# Patient Record
Sex: Female | Born: 1935 | Race: White | Hispanic: No | Smoking: Never smoker
Health system: Southern US, Community
[De-identification: ages and names within clinical notes are randomized; demographics above are authoritative.]

## PROBLEM LIST (undated history)

## (undated) DIAGNOSIS — I10 Essential (primary) hypertension: Secondary | ICD-10-CM

## (undated) DIAGNOSIS — G20 Parkinson's disease: Secondary | ICD-10-CM

## (undated) DIAGNOSIS — F0390 Unspecified dementia without behavioral disturbance: Secondary | ICD-10-CM

## (undated) DIAGNOSIS — M1990 Unspecified osteoarthritis, unspecified site: Secondary | ICD-10-CM

---

## 1997-11-16 ENCOUNTER — Other Ambulatory Visit: Admission: RE | Admit: 1997-11-16 | Discharge: 1997-11-16 | Payer: Self-pay | Admitting: Internal Medicine

## 1997-12-26 ENCOUNTER — Ambulatory Visit (HOSPITAL_COMMUNITY): Admission: RE | Admit: 1997-12-26 | Discharge: 1997-12-26 | Payer: Self-pay | Admitting: Gastroenterology

## 1998-11-18 ENCOUNTER — Encounter: Payer: Self-pay | Admitting: Internal Medicine

## 1998-11-18 ENCOUNTER — Encounter: Admission: RE | Admit: 1998-11-18 | Discharge: 1998-11-18 | Payer: Self-pay | Admitting: Internal Medicine

## 1999-11-27 ENCOUNTER — Encounter: Payer: Self-pay | Admitting: Internal Medicine

## 1999-11-27 ENCOUNTER — Other Ambulatory Visit: Admission: RE | Admit: 1999-11-27 | Discharge: 1999-11-27 | Payer: Self-pay | Admitting: Internal Medicine

## 1999-11-27 ENCOUNTER — Encounter: Admission: RE | Admit: 1999-11-27 | Discharge: 1999-11-27 | Payer: Self-pay | Admitting: Internal Medicine

## 2000-07-01 ENCOUNTER — Encounter: Payer: Self-pay | Admitting: Orthopedic Surgery

## 2000-07-01 ENCOUNTER — Inpatient Hospital Stay (HOSPITAL_COMMUNITY): Admission: AD | Admit: 2000-07-01 | Discharge: 2000-07-05 | Payer: Self-pay | Admitting: Orthopedic Surgery

## 2000-07-01 ENCOUNTER — Encounter: Payer: Self-pay | Admitting: Emergency Medicine

## 2000-07-05 ENCOUNTER — Inpatient Hospital Stay (HOSPITAL_COMMUNITY)
Admission: RE | Admit: 2000-07-05 | Discharge: 2000-07-13 | Payer: Self-pay | Admitting: Physical Medicine & Rehabilitation

## 2000-11-30 ENCOUNTER — Encounter: Payer: Self-pay | Admitting: Internal Medicine

## 2000-11-30 ENCOUNTER — Encounter: Admission: RE | Admit: 2000-11-30 | Discharge: 2000-11-30 | Payer: Self-pay | Admitting: Internal Medicine

## 2001-06-13 ENCOUNTER — Encounter: Admission: RE | Admit: 2001-06-13 | Discharge: 2001-06-13 | Payer: Self-pay | Admitting: Internal Medicine

## 2001-06-13 ENCOUNTER — Encounter: Payer: Self-pay | Admitting: Internal Medicine

## 2001-08-09 ENCOUNTER — Encounter (HOSPITAL_COMMUNITY): Admission: RE | Admit: 2001-08-09 | Discharge: 2001-09-08 | Payer: Self-pay | Admitting: Internal Medicine

## 2001-12-14 ENCOUNTER — Encounter: Payer: Self-pay | Admitting: Internal Medicine

## 2001-12-14 ENCOUNTER — Encounter: Admission: RE | Admit: 2001-12-14 | Discharge: 2001-12-14 | Payer: Self-pay | Admitting: Internal Medicine

## 2002-12-26 ENCOUNTER — Encounter: Admission: RE | Admit: 2002-12-26 | Discharge: 2002-12-26 | Payer: Self-pay | Admitting: Internal Medicine

## 2003-02-06 ENCOUNTER — Inpatient Hospital Stay (HOSPITAL_COMMUNITY): Admission: EM | Admit: 2003-02-06 | Discharge: 2003-02-08 | Payer: Self-pay | Admitting: Emergency Medicine

## 2003-10-02 ENCOUNTER — Ambulatory Visit (HOSPITAL_COMMUNITY): Admission: RE | Admit: 2003-10-02 | Discharge: 2003-10-02 | Payer: Self-pay | Admitting: Neurology

## 2004-01-30 ENCOUNTER — Encounter: Admission: RE | Admit: 2004-01-30 | Discharge: 2004-01-30 | Payer: Self-pay | Admitting: Internal Medicine

## 2005-02-02 ENCOUNTER — Encounter: Admission: RE | Admit: 2005-02-02 | Discharge: 2005-02-02 | Payer: Self-pay | Admitting: Internal Medicine

## 2006-02-05 ENCOUNTER — Encounter: Admission: RE | Admit: 2006-02-05 | Discharge: 2006-02-05 | Payer: Self-pay | Admitting: Internal Medicine

## 2007-03-15 ENCOUNTER — Encounter: Admission: RE | Admit: 2007-03-15 | Discharge: 2007-03-15 | Payer: Self-pay | Admitting: Internal Medicine

## 2008-03-15 ENCOUNTER — Encounter: Admission: RE | Admit: 2008-03-15 | Discharge: 2008-03-15 | Payer: Self-pay | Admitting: Internal Medicine

## 2008-12-13 ENCOUNTER — Ambulatory Visit (HOSPITAL_COMMUNITY): Admission: RE | Admit: 2008-12-13 | Discharge: 2008-12-13 | Payer: Self-pay | Admitting: Internal Medicine

## 2009-03-22 ENCOUNTER — Encounter: Payer: Self-pay | Admitting: Orthopedic Surgery

## 2009-04-10 ENCOUNTER — Ambulatory Visit (HOSPITAL_COMMUNITY): Admission: RE | Admit: 2009-04-10 | Discharge: 2009-04-10 | Payer: Self-pay | Admitting: Internal Medicine

## 2009-04-11 ENCOUNTER — Ambulatory Visit (HOSPITAL_COMMUNITY): Admission: RE | Admit: 2009-04-11 | Discharge: 2009-04-11 | Payer: Self-pay | Admitting: Internal Medicine

## 2009-04-11 ENCOUNTER — Encounter: Payer: Self-pay | Admitting: Orthopedic Surgery

## 2009-04-15 ENCOUNTER — Encounter: Admission: RE | Admit: 2009-04-15 | Discharge: 2009-04-15 | Payer: Self-pay | Admitting: Internal Medicine

## 2009-04-17 ENCOUNTER — Ambulatory Visit: Payer: Self-pay | Admitting: Orthopedic Surgery

## 2009-04-17 DIAGNOSIS — S22009A Unspecified fracture of unspecified thoracic vertebra, initial encounter for closed fracture: Secondary | ICD-10-CM | POA: Insufficient documentation

## 2009-04-17 DIAGNOSIS — M545 Low back pain: Secondary | ICD-10-CM

## 2009-05-13 ENCOUNTER — Telehealth: Payer: Self-pay | Admitting: Orthopedic Surgery

## 2010-02-02 ENCOUNTER — Encounter: Payer: Self-pay | Admitting: Neurology

## 2010-02-11 NOTE — Assessment & Plan Note (Signed)
Summary: CONSULT/TREAT T7 FX/XR+MRI [facility]/REF R.FAGAN/MEDICARE,AARP/CAF   Vital Signs:  Patient profile:   77 year old female Height:      62 inches Weight:      112 pounds Pulse rate:   68 / minute Resp:     16 per minute  Vitals Entered By: Fuller Canada MD (April 17, 2009 9:16 AM)  Visit Type:  Initial Consult Referring Provider:  Dr. Ouida Sills Primary Provider:  Dr. Ouida Sills  CC:  T spine fracture.  History of Present Illness: This is an 77 year old female who has had multiple falls because of Parkinson's disease and now presents after a recent fall complaining of back pain.  X-rays were obtained of the thoracic spine and this included MRI of the thoracic spine which showed a T6-T7 bulging disc with facet arthritis as well as a 70% loss of height compression fracture with edema.  Her the patient was placed in a Cash brace but could not wear it because of her size.  She had such severe kyphosis that the brace R. Winn intervertebral when she sat down.  She complains of right-sided lower back pain and gluteal pain with no radiculopathy, no numbness or tingling in hands or feet or legs.  Her pain is 06/1010 throbbing and on relieved by Tylenol and Advil.  [facility] xrays and MRI 04/11/09.  DOI 2 weeks ago, fell.  Meds: Actonel, Carbidopa/levodopa, Chlordiazep/Amitrip, Digoxin, HCTZ, Lorazepam, Sular, Enablez.     Allergies (verified): No Known Drug Allergies  Past History:  Past Medical History: Parkinsons Osteoporosis htn anxiety  Past Surgical History: hip  Family History: FH of Cancer:   Social History: Patient is widowed.  retired no smoking no alcohol no caffeine  Review of Systems Constitutional:  Denies weight loss, weight gain, fever, chills, and fatigue. Cardiovascular:  Denies chest pain, palpitations, fainting, and murmurs. Respiratory:  Denies short of breath, wheezing, couch, tightness, pain on inspiration, and snoring . Gastrointestinal:  Complains of  heartburn; denies nausea, vomiting, diarrhea, constipation, and blood in your stools. Genitourinary:  Denies frequency, urgency, difficulty urinating, painful urination, flank pain, and bleeding in urine. Neurologic:  Complains of unsteady gait; denies numbness, tingling, dizziness, tremors, and seizure. Musculoskeletal:  Denies joint pain, swelling, instability, stiffness, redness, heat, and muscle pain. Endocrine:  Denies excessive thirst, exessive urination, and heat or cold intolerance. Psychiatric:  Complains of depression; denies nervousness, anxiety, and hallucinations. Skin:  Complains of redness; denies changes in the skin, poor healing, rash, and itching. HEENT:  Denies blurred or double vision, eye pain, redness, and watering. Immunology:  Denies seasonal allergies, sinus problems, and allergic to bee stings. Hemoatologic:  Denies easy bleeding and brusing.  Physical Exam  Additional Exam:  GEN: well developed, well nourished, normal grooming and hygiene, no deformity and normal body habitus.  vitals are weight 112 pounds height 5 feet 2 CDV: pulses are normal, no edema, no erythema. no tenderness  Lymph: normal lymph nodes   Skin: no rashes, skin lesions or open sores   NEURO: normal coordination, reflexes, sensation.   Psyche: awake, alert and oriented. Mood normal   Gait:  ambulates with severe kyphosis of the thoracic spine, uses a walker  Inspection, thoracic- lumbar spine skin is normal.  Cyst.  Tenderness in the RIGHT gluteal region nontender over the entire lumbar spine.  Upper extremities and lower extremities have normal motor exam.      Impression & Recommendations:  Problem # 1:  VERTEBRAL FRACTURE, THORACIC SPINE (ICD-805.2) Assessment New  assessment:  I did not find tenderness in the RIGHT the lumbar spine specifically at the fracture site.  She was tender in the RIGHT lower back and gluteal region.  Recommend Lidoderm patch, we discussed  vertebroplasty but I do not feel it would be of benefit since she's not having tenderness at this area.  We also discussed the thoracic- lumbar bracing without pain or tenderness at the fracture site see no reason to go to a custom bracing  Orders: Misc. Referral (Misc. Ref) Consultation Level III (16109)  Problem # 2:  LOW BACK PAIN (ICD-724.2) Assessment: New  Orders: Consultation Level III (60454)  Patient Instructions: 1)  Please schedule a follow-up appointment as needed.

## 2010-02-11 NOTE — Letter (Signed)
Summary: History form  History form   Imported By: Jacklynn Ganong 04/18/2009 15:53:12  _____________________________________________________________________  External Attachment:    Type:   Image     Comment:   External Document

## 2010-02-11 NOTE — Letter (Signed)
Summary: Consultant Letter  All     ,     Phone:   Fax:     04/17/2009  Dear Dr.Roy :  I am writing to report my evaluation of [name], a 77 Years Old Female on whom I consulted 04/17/2009 at the office. When I saw the patient the Chief Complaint was "T spine fracture".    At that time the history of the present illness was as follows: This is an 77 year old female who has had multiple falls because of Parkinson's disease and now presents after a recent fall complaining of back pain.  X-rays were obtained of the thoracic spine and this included MRI of the thoracic spine which showed a T6-T7 bulging disc with facet arthritis as well as a 70% loss of height compression fracture with edema. Tandhe patient was placed in a Cash brace but could not wear it because of her size.  She had such severe kyphosis that the brace he is impinged on her throat and she sat down.  She complains of right-sided lower back pain and gluteal pain with no radiculopathy, no numbness or tingling in hands or feet or legs.  Her pain is 6/10; throbbing and not relieved by Tylenol and Advil.  [facility] xrays and MRI 04/11/09.  DOI 2 weeks ago, fell.  Meds: Actonel, Carbidopa/levodopa, Chlordiazep/Amitrip, Digoxin, HCTZ, Lorazepam, Sular, Enablez.   [name] Past Medical History at the time of my evaluation was notable for: VERTEBRAL FRACTURE, THORACIC SPINE (ICD-805.2).  .    Other history is as follows: Parkinsons Osteoporosis htn anxiety .  The family history is notable for: FH of Cancer:  .  The social history is notable for: Patient is widowed.  retired no smoking no alcohol no caffeine.    I have attached a copy of my clinic note to the end of this letter which details the review of systems and physical examination for your review. My assessment at that time was as follows: back pain and and thoracic T7 compression fracture.  The plan at that time was as follows:Lidoderm patches no bracing .  Anaisha's  active problems now include:  LOW BACK PAIN (ICD-724.2) VERTEBRAL FRACTURE, THORACIC SPINE (ICD-805.2)    I will send copies of the results of the above investigations as they become available to your office if you wish. The most recent office note and chart summary are attached. Thank you for the opportunity to participate in the care of [name].  Yours truly,   Fuller Canada MD

## 2010-02-11 NOTE — Medication Information (Signed)
Summary: RX Folder  RX Folder   Imported By: Cammie Sickle 04/18/2009 11:48:00  _____________________________________________________________________  External Attachment:    Type:   Image     Comment:   External Document

## 2010-02-11 NOTE — Progress Notes (Signed)
Summary: call from son question,possible side effect pain patch  Phone Note Call from Patient   Caller: Son Summary of Call: [name], patient's son and POA, called to relay that the pain patches seem to cause mother to be confused, "even hallucinating."  States he did remove patch and has not had on, since Friday 05/10/09.  He has also contacted primary care physician.   Please advise. His phone # [phone] (same as patient's)  Their pharmacy is [facility] in [place]. Initial call taken by: Cammie Sickle,  May 13, 2009 3:20 PM  Follow-up for Phone Call        whats the medciation? and dosage ?  stop the medication  Follow-up by: Fuller Canada MD,  May 13, 2009 4:34 PM  Additional Follow-up for Phone Call Additional follow up Details #1::        Lidoderm patch Additional Follow-up by: Cammie Sickle,  May 13, 2009 5:09 PM
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
## 2010-05-30 NOTE — Discharge Summary (Signed)
[place]. [facility]  Patient:    [name], [name]                         MRN: [id number] Adm. Date:  [id number] Disc. Date: [id number] Attending:  Herold Harms Dictator:   Arnoldo Morale, P.A.-C.                           Discharge Summary  ADMISSION DIAGNOSES: 1. Right displaced femoral neck fracture. 2. Hypertension. 3. Gastroesophageal reflux disease. 4. Depression. 5. Palpitations.  DISCHARGE DIAGNOSES: 1. Right displaced femoral neck fracture. 2. Hypertension. 3. Gastroesophageal reflux disease. 4. Depression. 5. Palpitations. 6. Temporary postoperative confusion. 7. Post hemorrhagic anemia.  SURGICAL PROCEDURE:  On July 01, 2000 Mrs. Parish underwent a right hip hemiarthroplasty by Dr. Jonny Ruiz L. Rendall assisted by Arnoldo Morale, P.A.-C.  COMPLICATIONS:  None.  CONSULTS: 1. Pharmacy consult for Coumadin therapy on July 01, 2000. 2. PT and rehab medicine consult on July 02, 2000.  HISTORY OF PRESENT ILLNESS:  This 77 year old white female was at home dusting a desk when she got her foot caught underneath the desk and fell landing on her right hip.  She was able to stand but complained of a moderate amount of pain in that right hip.  She was subsequently brought to [facility] Emergency Department where she was found to have a right displaced femoral neck fracture.  She was admitted for surgical fixation of this fracture.  HOSPITAL COURSE:  Mrs. Crockett was taken from the emergency room to the operating room and she underwent surgery that evening.  She tolerated that well and without immediate postoperative complications.  She was subsequently transferred to 5000.  On postoperative day one, she was afebrile.  Vital signs were stable.  Hemoglobin was 10, hematocrit 28.6.  Right hip dressing was intact and leg was neurovascularly intact.  She was started on PT per protocol and monitored.  On postoperative day two, temperature maximum was 102.2.   Vitals were stable. She did have some difficulty with following commands and seemed slightly confused.  This did seem to resolve over the weekend.  Dressing was changed and leg was neurovascularly intact.  She continued to make good progress over the next several days and was believed to be ready to go to rehab on July 05, 2000.  Her temperature maximum at that time was 101.1, pulse 86, respirations 20, and blood pressure 110/65. Right hip incision well approximated with staples without drainage.  PT was 19.6 with an INR of 2.1.  She was ready for transfer to rehab that day and was transferred.  DISCHARGE INSTRUCTIONS:  She is to continue her current hospitalization medications and diet with adjustments to be made per rehab.  CURRENT MEDICATIONS AT THE TIME OF TRANSFER:  1. Colace 100 mg p.o. b.i.d.  2. Senokot one tablet p.o. b.i.d.  3. Vitamin E 400 iu p.o. q.d.  4. Multivitamin one tablet p.o. q.d.  5. Calcium carbonate 500 mg p.o. q.d.  6. Protonix 40 mg p.o. q.d.  7. Ativan 1 mg p.o. q.h.s.  8. Sular 20 mg p.o. q.d.  9. Lanoxin 0.125 mg p.o. q.d. 10. Elavil 12.5 mg p.o. q.d. 11. Librium 5 mg p.o. q.d. 12. Coumadin 5 mg p.o. q.d. 13. EOC LOC p.r.n. 14. Reglan 10 mg p.o. q.8h. p.r.n. 15. Percocet 1 to 2 tablets p.o. q.4h. p.r.n. pain. 16. Phenergan 12.5 to 25 mg  p.o. q.6h. p.r.n. 17. Skelaxin 1 to 2 tablets p.o. q.6-8h. p.r.n. pain. 18. Benadryl 12.5 to 25 mg IV q.6h. p.r.n. itching. 19. Reglan 10 mg IV q.6h. p.r.n. nausea.  ACTIVITY:  She is to continue weight bearing as tolerated on the right leg with the use of a walker. She is to continue PT and OT per rehab protocol.  Staples can be removed from her right hip incision on postoperative day 14 with Steri-Strips with Benzoin applied.  She needs to follow up with Dr. Priscille Kluver in our office in approximately two weeks after discharge or at postoperative day 14 if staples are not removed in rehab.  Please notify Dr. Priscille Kluver  if temperature greater than 101.5, chills, pain unrelieved by pain medications or foul smelling drainage from the wound.  LABORATORY DATA:  X-ray taken at [facility] on July 01, 2000 showed a right femoral neck fracture that was impacted and slightly displaced.  Chest x-ray at that time showed mild cardiac prominence, calcified granuloma left upper lobe and no acute findings.  X-ray taken postoperatively on July 01, 2000 showed right hip arthroplasty in satisfactory position with the pelvis intact.  On July 02, 2000 hemoglobin 10, hematocrit 28.6.  On July 03, 2000 hemoglobin 9.7, hematocrit 27.8.  On July 04, 2000 hemoglobin 9.1, hematocrit 26.2.  On July 02, 2000 PT 15, INR 1.3.  On July 04, 2000 PT 18.7, INR 1.9.  On July 01, 2000 glucose was 116.  On July 02, 2000 it was 123 and on July 03, 2000 it was 131.  All other laboratory studies were within normal limits. DD:  07/27/00 TD:  07/27/00 Job: [id number]

## 2010-05-30 NOTE — Discharge Summary (Signed)
[place]. [facility]  Patient:    [name], [name]                         MRN: [id number] Adm. Date:  [id number] Disc. Date: [id number] Attending:  Herold Harms Dictator:   Junie Bame, P.A. CC:         Rande Brunt. Thomasena Edis, M.D.  John L. Dorothyann Gibbs, M.D.  Theressa Millard, M.D.   Discharge Summary  DISCHARGE DIAGNOSES: 1. Status post right total hip arthroplasty. 2. Hypertension. 3. Anemia. 4. Urinary tract infection. 5. Anxiety. 6. Gastroesophageal reflux disease.  HISTORY OF PRESENT ILLNESS:  [name] is a 77 year old white female admitted on July 01, 2000 after a fall sustaining a right displaced femoral neck fracture.  The patient underwent a right total hip replacement on July 01, 2000 by Dr. Jonny Ruiz L. Rendall III.  The patient was placed on Coumadin for DVT prophylaxis.  Postoperative complications were anemia secondary to blood loss.  PT report at that time indicated the patient was ambulating moderate assist, +1 for safety, nine feet with standard walker.  She could transfer 2+ assist, 1+ for safety. She is weightbearing as tolerated on the right.  PAST MEDICAL HISTORY:  Significant for hypertension, GERD, depression, and anxiety.  PAST SURGICAL HISTORY:  None.  MEDICATIONS: 1. Prilosec 20 mg q.d. 2. Ativan 1 mg q.h.s. 3. Zoloft 20 mg. 4. Lanoxin capsule 0.1 mg q.d. 5. Multivitamin, vitamin E, and Caltrate.  SOCIAL HISTORY:  The patient lives alone in [place].  She was independent prior to admission.  She lives in a one level apartment with 15 steps to entry.  Plans to go home with daughter after discharge and who will assist as needed.  She does have two children.  HOSPITAL COURSE:  [name] was admitted to rehabilitation unit on July 05, 2000 for comprehensive inpatient rehabilitation.  She received more than three hours of PT and OT therapy daily.  Michele. Washington nine-day hospital course while in rehabilitation is  significant for urinary tract infection and anemia.  On July 06, 2000, [name] hemoglobin was 7.9, hematocrit was 23.2.  She was transferred two units of packed red blood cells and was placed on Trinsicon p.o. b.i.d.  She remained on Coumadin throughout her rehabilitation stay for DVT prophylaxis.  Her blood pressure remained on stable on Sular.  On July 08, 2000, she was placed on amoxicillin 250 mg p.o. t.i.d. x 7 days for a urinary tract infection.  Michele. Washington did state that she take Lanoxicaps for her nervousness and anxiety. Therefore, digoxin was discontinued and she was placed on home dose of Lanoxicaps.  Besides minor constipation, there was no other major medical complications during her hospital stay.  Her posttransfusion hemoglobin was 10.9, her hematocrit was 31.7.  Her latest potassium was 3.8, glucose 94, BUN 11, creatinine 0.8, and sodium 139.  Her urine culture on July 05, 2000 demonstrated greater than 100,000 colonies of E. coli and Proteus mirabilis.  At time of discharge, her right hip incision was clean, nonerythematous, and demonstrated no signs of infection.  Her vital signs were stable.  Her staples were removed and Steri-Strips were applied.  PT report indicated that the patient was ambulating with close supervision with rolling walker 2 to 100 feet x 1 and she can transfer sit to stand with close supervision.  She can perform most ADLs with supervision and minimal assist.  DISCHARGE MEDICATIONS:  1.  Prilosec 20 mg 1 tablet q.d.  2. Sular 20 mg 1 tablet q.d.  3. Lanoxicaps 0.1 mg 1 tablet q.d.  4. Multivitamins 1 tablet q.d.  5. Caltrate to resume home regimen.  6. Coumadin 4 mg 1 tablet until July 31, 2000.  7. Trinsicon 1 tablet b.i.d.  8. Elavil 12.5 mg 1 tablet at night.  9. Librium 5 mg 1 tablet at night. 10. Amoxicillin 250 mg 3 times daily for 2 more days. 11. Oxycodone 5 mg 1-2 tablets q.4-6h. p.r.n. pain.  ACTIVITY:  Use walker and observe hip  precautions.  She will receive [facility] for PT, OT, and nursing.  They are to monitor Coumadin and they are to call the Coumadin results to Dr. Theressa Millard.  The first draw will be July 16, 2000.  She is to call Dr. Jonny Ruiz L. Rendall III within two weeks. Follow up with Dr. Rande Brunt. Collins as needed and follow up with Dr. Theressa Millard within four to six weeks. DD:  07/18/00 TD:  07/18/00 Job: [id number]
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
## 2010-05-30 NOTE — Op Note (Signed)
[facility]  Patient:    [name], [name]                         MRN: [id number] Proc. Date: 07/01/00 Adm. Date:  [id number] Attending:  Carolan Shiver Ii                           Operative Report  PREOPERATIVE DIAGNOSIS:  Displaced right femoral neck fracture.  POSTOPERATIVE DIAGNOSIS:  Displaced right femoral neck fracture.  OPERATION PERFORMED:  AML bipolar hemiarthroplasty.  SURGEON:  John L. Dorothyann Gibbs, M.D.  ASSISTANT:  Arnoldo Morale, P.A.  ANESTHESIA:  General.  PATHOLOGY:  The patient was found to have very soft bone throughout the greater trochanter, femoral neck area.  The reaming was almost totally nonexistent up until  we reached a 13.5 reamer.  DESCRIPTION OF PROCEDURE:  Under general anesthesia, the patient was placed in the left lateral decubitus position and the right hip was prepared with DuraPrep and draped as a sterile field.  A posterior approach was made splitting the iliotibial band in the line of its fibers, separating the tensor fascia lata and inserting a Charnley retractor.  The short external rotators and hip capsule were taken down from the bone with electrocautery and the hip capsule was opened with electrocautery in a T-shaped manner.  The fracture was exposed.  The fracture was further displaced and the superior femoral neck exposed with a Mueller inferiorly and a Bennett around the femoral neck. The superior femoral neck was then exposed and the IM initiator was used followed by canal finder followed by reaming sequentially up to 14.5 mm.  It should be noted there was absolutely no bite of the reamer ____________ 13.5.  After reaming to 14.5, the femoral neck was osteotomized at the bottom of the break and the canal was then rasped with a 12, 13.5 and 15 rasps using a calcar reamer to taper the most medial part of the calcar.  The hip ball was then removed and measured at 49.  Trial reduction of a 49 hip ball  and a 50 revealed a 49 was excellent and this was then trialed off a rasp which revealed good fit, alignment and stability with a +5 neck length.  Permanent components were then obtained.  Care was taken to make sure all bone chips were removed from the acetabulum as well as ligamentum teres.  Bleeding vessels were stopped.  The wound was irrigated with antibiotic solution.  The permanent 15 mm modified medial AML stem was then inserted and tamped in place with an excellent scratch fit.  A +5 hip ball, 28 mm was inserted and the 49 bipolar head was then attached.  The hip was then reduced and put in the socket after care taken to grab hold of the capsule flap superiorly and inferiorly.  Once this was reduced, tested range of motion again revealed excellent fit, alignment and stability.  #1 surgidac was then used to close the capsule and repair the short external rotators.  The iliotibial band was then closed with mattress sutures, the subcutaneous with one layer of 0 and 2-0 Vicryl and skin with clips.  Operative time about 50 minutes.  Estimated blood loss about 200 cc.  The patient tolerated the procedure well and returned to recovery in good condition. DD:  07/01/00 TD:  07/02/00 Job: [id number]

## 2013-01-06 ENCOUNTER — Encounter (HOSPITAL_COMMUNITY): Payer: Self-pay | Admitting: Emergency Medicine

## 2013-01-06 ENCOUNTER — Emergency Department (HOSPITAL_COMMUNITY): Payer: Medicare Other

## 2013-01-06 ENCOUNTER — Inpatient Hospital Stay (HOSPITAL_COMMUNITY)
Admission: EM | Admit: 2013-01-06 | Discharge: 2013-01-09 | DRG: 640 | Disposition: A | Discharge status: Hospice | Payer: Medicare Other | Attending: Internal Medicine | Admitting: Internal Medicine

## 2013-01-06 DIAGNOSIS — M545 Low back pain: Secondary | ICD-10-CM | POA: Diagnosis present

## 2013-01-06 DIAGNOSIS — Z681 Body mass index (BMI) 19 or less, adult: Secondary | ICD-10-CM

## 2013-01-06 DIAGNOSIS — E870 Hyperosmolality and hypernatremia: Principal | ICD-10-CM | POA: Diagnosis present

## 2013-01-06 DIAGNOSIS — L8995 Pressure ulcer of unspecified site, unstageable: Secondary | ICD-10-CM | POA: Diagnosis present

## 2013-01-06 DIAGNOSIS — Z7401 Bed confinement status: Secondary | ICD-10-CM

## 2013-01-06 DIAGNOSIS — L89109 Pressure ulcer of unspecified part of back, unspecified stage: Secondary | ICD-10-CM | POA: Diagnosis present

## 2013-01-06 DIAGNOSIS — F0280 Dementia in other diseases classified elsewhere without behavioral disturbance: Secondary | ICD-10-CM | POA: Diagnosis present

## 2013-01-06 DIAGNOSIS — E871 Hypo-osmolality and hyponatremia: Secondary | ICD-10-CM | POA: Diagnosis present

## 2013-01-06 DIAGNOSIS — Z66 Do not resuscitate: Secondary | ICD-10-CM | POA: Diagnosis present

## 2013-01-06 DIAGNOSIS — R627 Adult failure to thrive: Secondary | ICD-10-CM | POA: Diagnosis present

## 2013-01-06 DIAGNOSIS — E876 Hypokalemia: Secondary | ICD-10-CM | POA: Diagnosis present

## 2013-01-06 DIAGNOSIS — N390 Urinary tract infection, site not specified: Secondary | ICD-10-CM | POA: Diagnosis present

## 2013-01-06 DIAGNOSIS — E43 Unspecified severe protein-calorie malnutrition: Secondary | ICD-10-CM | POA: Diagnosis present

## 2013-01-06 DIAGNOSIS — G20 Parkinson's disease: Secondary | ICD-10-CM | POA: Diagnosis present

## 2013-01-06 DIAGNOSIS — M4120 Other idiopathic scoliosis, site unspecified: Secondary | ICD-10-CM | POA: Diagnosis present

## 2013-01-06 DIAGNOSIS — M1990 Unspecified osteoarthritis, unspecified site: Secondary | ICD-10-CM | POA: Insufficient documentation

## 2013-01-06 DIAGNOSIS — F0390 Unspecified dementia without behavioral disturbance: Secondary | ICD-10-CM | POA: Diagnosis present

## 2013-01-06 DIAGNOSIS — E860 Dehydration: Secondary | ICD-10-CM

## 2013-01-06 DIAGNOSIS — I10 Essential (primary) hypertension: Secondary | ICD-10-CM | POA: Diagnosis present

## 2013-01-06 HISTORY — DX: Unspecified osteoarthritis, unspecified site: M19.90

## 2013-01-06 HISTORY — DX: Unspecified dementia, unspecified severity, without behavioral disturbance, psychotic disturbance, mood disturbance, and anxiety: F03.90

## 2013-01-06 HISTORY — DX: Parkinson's disease: G20

## 2013-01-06 HISTORY — DX: Essential (primary) hypertension: I10

## 2013-01-06 LAB — URINALYSIS, ROUTINE W REFLEX MICROSCOPIC
Protein, ur: 100 mg/dL — AB
Specific Gravity, Urine: 1.015 (ref 1.005–1.030)
Urobilinogen, UA: 1 mg/dL (ref 0.0–1.0)
pH: 8.5 — ABNORMAL HIGH (ref 5.0–8.0)

## 2013-01-06 LAB — BASIC METABOLIC PANEL
BUN: 62 mg/dL — ABNORMAL HIGH (ref 6–23)
Calcium: 10.5 mg/dL (ref 8.4–10.5)
Creatinine, Ser: 0.82 mg/dL (ref 0.50–1.10)
Glucose, Bld: 142 mg/dL — ABNORMAL HIGH (ref 70–99)
Sodium: 150 mEq/L — ABNORMAL HIGH (ref 135–145)

## 2013-01-06 LAB — CBC WITH DIFFERENTIAL/PLATELET
Basophils Absolute: 0 10*3/uL (ref 0.0–0.1)
Eosinophils Relative: 0 % (ref 0–5)
Hemoglobin: 15.2 g/dL — ABNORMAL HIGH (ref 12.0–15.0)
Lymphs Abs: 1.1 10*3/uL (ref 0.7–4.0)
MCH: 32.7 pg (ref 26.0–34.0)
MCHC: 32.5 g/dL (ref 30.0–36.0)
Neutro Abs: 7.9 10*3/uL — ABNORMAL HIGH (ref 1.7–7.7)
Neutrophils Relative %: 84 % — ABNORMAL HIGH (ref 43–77)
Platelets: 209 10*3/uL (ref 150–400)
RBC: 4.65 MIL/uL (ref 3.87–5.11)
RDW: 14.3 % (ref 11.5–15.5)

## 2013-01-06 LAB — URINE MICROSCOPIC-ADD ON

## 2013-01-06 LAB — TROPONIN I: Troponin I: <0.3 ng/mL (ref <0.30)

## 2013-01-06 MED ORDER — DEXTROSE 5 % IV SOLN
1.0000 g | Freq: Once | INTRAVENOUS | Status: DC
  Filled 2013-01-06: qty 10

## 2013-01-06 MED ORDER — CARBIDOPA-LEVODOPA 25-100 MG PO TABS
3.0000 | ORAL_TABLET | ORAL | Status: DC
  Administered 2013-01-07 – 2013-01-09 (×6): 3 via ORAL
  Filled 2013-01-06 (×7): qty 3

## 2013-01-06 MED ORDER — DEXTROSE 5 % IV SOLN
1.0000 g | INTRAVENOUS | Status: DC
  Administered 2013-01-06 – 2013-01-09 (×4): 1 g via INTRAVENOUS
  Filled 2013-01-06 (×3): qty 10

## 2013-01-06 MED ORDER — LORAZEPAM 0.5 MG PO TABS
0.5000 mg | ORAL_TABLET | Freq: Every day | ORAL | Status: DC
Start: 2013-01-06 — End: 2013-01-09
  Administered 2013-01-06 – 2013-01-07 (×2): 0.5 mg via ORAL
  Filled 2013-01-06 (×3): qty 1

## 2013-01-06 MED ORDER — ONDANSETRON HCL 4 MG PO TABS: 4.0000 mg | ORAL_TABLET | Freq: Four times a day (QID) | ORAL | Status: DC | PRN

## 2013-01-06 MED ORDER — CHLORDIAZEPOXIDE-AMITRIPTYLINE 5-12.5 MG PO TABS: 1.0000 | ORAL_TABLET | Freq: Every day | ORAL | Status: DC

## 2013-01-06 MED ORDER — CHLORDIAZEPOXIDE HCL 5 MG PO CAPS
5.0000 mg | ORAL_CAPSULE | Freq: Every day | ORAL | Status: DC
  Administered 2013-01-07 – 2013-01-08 (×2): 5 mg via ORAL
  Filled 2013-01-06 (×2): qty 1

## 2013-01-06 MED ORDER — ALBUTEROL SULFATE (2.5 MG/3ML) 0.083% IN NEBU: 2.5000 mg | INHALATION_SOLUTION | RESPIRATORY_TRACT | Status: DC | PRN

## 2013-01-06 MED ORDER — ACETAMINOPHEN 650 MG RE SUPP: 650.0000 mg | Freq: Four times a day (QID) | RECTAL | Status: DC | PRN

## 2013-01-06 MED ORDER — SODIUM CHLORIDE 0.9 % IV SOLN
INTRAVENOUS | Status: DC
  Administered 2013-01-06: 15:00:00 via INTRAVENOUS

## 2013-01-06 MED ORDER — AMITRIPTYLINE HCL 25 MG PO TABS
12.5000 mg | ORAL_TABLET | Freq: Every day | ORAL | Status: DC
  Administered 2013-01-07 – 2013-01-08 (×2): 12.5 mg via ORAL
  Filled 2013-01-06 (×2): qty 1

## 2013-01-06 MED ORDER — ASPIRIN EC 81 MG PO TBEC
81.0000 mg | DELAYED_RELEASE_TABLET | Freq: Every day | ORAL | Status: DC
  Administered 2013-01-07 – 2013-01-08 (×2): 81 mg via ORAL
  Filled 2013-01-06 (×2): qty 1

## 2013-01-06 MED ORDER — GUAIFENESIN-DM 100-10 MG/5ML PO SYRP: 5.0000 mL | ORAL_SOLUTION | ORAL | Status: DC | PRN

## 2013-01-06 MED ORDER — ONDANSETRON HCL 4 MG/2ML IJ SOLN: 4.0000 mg | Freq: Four times a day (QID) | INTRAMUSCULAR | Status: DC | PRN

## 2013-01-06 MED ORDER — DIGOXIN 125 MCG PO TABS
0.1250 mg | ORAL_TABLET | Freq: Every day | ORAL | Status: DC
  Administered 2013-01-07 – 2013-01-09 (×3): 0.125 mg via ORAL
  Filled 2013-01-06 (×4): qty 1

## 2013-01-06 MED ORDER — METOPROLOL TARTRATE 25 MG PO TABS
12.5000 mg | ORAL_TABLET | Freq: Two times a day (BID) | ORAL | Status: DC
  Administered 2013-01-07 – 2013-01-08 (×3): 12.5 mg via ORAL
  Filled 2013-01-06 (×4): qty 1

## 2013-01-06 MED ORDER — ACETAMINOPHEN 325 MG PO TABS: 650.0000 mg | ORAL_TABLET | Freq: Four times a day (QID) | ORAL | Status: DC | PRN

## 2013-01-06 MED ORDER — SODIUM CHLORIDE 0.9 % IV SOLN: INTRAVENOUS | Status: DC

## 2013-01-06 MED ORDER — POLYETHYLENE GLYCOL 3350 17 G PO PACK: 17.0000 g | PACK | Freq: Every day | ORAL | Status: DC | PRN

## 2013-01-06 NOTE — ED Notes (Signed)
Son states his mom has not been eating or drinking well over the past six weeks. States she is getting worse

## 2013-01-06 NOTE — H&P (Signed)
[facility]                                                                                    Patient Demographics  [name], is a 79 y.o. female  MRN: [id number]   DOB - Nov 08, 1919  Admit Date - 01/06/2013  Outpatient Primary MD for the patient is Carylon Perches, MD   With History of -  Past Medical History  Diagnosis Date  . Hypertension   . Parkinson disease oseto  . Osteoarthritis   . Dementia       History reviewed. No pertinent past surgical history.  in for   Chief Complaint  Patient presents with  . Failure To Thrive     HPI  [name]  is a 79 y.o. female, with history of advanced underlying Parkinson's disease, dementia, scoliosis, osteoarthritis, hypertension who is essentially bedbound and is being evaluated by hospice staff at home, who has been gradually declining for the last several weeks has been brought in by her son for acute on chronic decline in her condition. According to the son she has been not eating or drinking anything for the last few days, appears increasingly dehydrated and frail, unable to communicate or talk or follow commands, he says her decline has been gradual and not sudden and has been ongoing for several weeks but has been significantly more over the last 1 week. He brought her to the ER where she was clinically found to be extremely dehydrated and frail, she appeared cachectic, her workup was suggestive of UTI and hyponatremia. She had nonspecific EKG changes. I was called to admit the patient for dehydration, hyponatremia, UTI, failure to thrive.    Review of Systems    Unable to provide review of systems.   Social History History  Substance Use Topics  . Smoking status: Never Smoker   . Smokeless tobacco: Not on file  . Alcohol Use: No      Family History No early CAD  Prior to Admission medications   Medication Sig Start Date End Date Taking? Authorizing Provider  carbidopa-levodopa (SINEMET IR) 25-100 MG per tablet  Take 3 tablets by mouth 3 (three) times daily. At 8 am, 12 pm, and 5 pm.   Yes Historical Provider, MD  chloridazePOXIDE-amitriptyline (LIMBITROL) 5-12.5 MG per tablet Take 1 tablet by mouth daily.   Yes Historical Provider, MD  digoxin (LANOXIN) 0.125 MG tablet Take 0.125 mg by mouth daily.   Yes Historical Provider, MD  hydrochlorothiazide (HYDRODIURIL) 25 MG tablet Take 25 mg by mouth daily.   Yes Historical Provider, MD  LORazepam (ATIVAN) 1 MG tablet Take 1 mg by mouth 2 (two) times daily.   Yes Historical Provider, MD  memantine (NAMENDA) 10 MG tablet Take 10 mg by mouth 2 (two) times daily.   Yes Historical Provider, MD    No Known Allergies  Physical Exam  Vitals  Blood pressure 114/71, pulse 106, temperature 100.6 F (38.1 C), temperature source Oral, resp. rate 22, SpO2 94.00%.   1. General extremely frail cachectic elderly white female lying in bed in NAD, she looks dehydrated  2. Unable to answer questions or follow commands appropriately appears  tired and fatigued.  3. No F.N deficits, ALL C.Nerves Intact, moves all 4 extremities to self  4. Ears and Eyes appear Normal, Conjunctivae clear, PERRLA. Moist Oral Mucosa.  5. Supple Neck, No JVD, No cervical lymphadenopathy appriciated, No Carotid Bruits.  6. Symmetrical Chest wall movement, Good air movement bilaterally, CTAB.  7. RRR, No Gallops, Rubs or Murmurs, No Parasternal Heave.  8. Positive Bowel Sounds, Abdomen Soft, Non tender, No organomegaly appriciated,No rebound -guarding or rigidity.  9.  No Cyanosis, Normal Skin Turgor, No Skin Rash or Bruise.  10. Good muscle tone,  joints appear normal , no effusions, Normal ROM.  11. No Palpable Lymph Nodes in Neck or Axillae     Data Review  CBC  Recent Labs Lab 01/06/13 1320  WBC 9.5  HGB 15.2*  HCT 46.8*  PLT 209  MCV 100.6*  MCH 32.7  MCHC 32.5  RDW 14.3  LYMPHSABS 1.1  MONOABS 0.5  EOSABS 0.0  BASOSABS 0.0    ------------------------------------------------------------------------------------------------------------------  Chemistries   Recent Labs Lab 01/06/13 1320  NA 150*  K 3.5  CL 106  CO2 30  GLUCOSE 142*  BUN 62*  CREATININE 0.82  CALCIUM 10.5   ------------------------------------------------------------------------------------------------------------------ CrCl is unknown because both a height and weight (above a minimum accepted value) are required for this calculation. ------------------------------------------------------------------------------------------------------------------ No results found for this basename: TSH, T4TOTAL, FREET3, T3FREE, THYROIDAB,  in the last 72 hours   Coagulation profile No results found for this basename: INR, PROTIME,  in the last 168 hours ------------------------------------------------------------------------------------------------------------------- No results found for this basename: DDIMER,  in the last 72 hours -------------------------------------------------------------------------------------------------------------------  Cardiac Enzymes  Recent Labs Lab 01/06/13 1320  TROPONINI <0.30   ------------------------------------------------------------------------------------------------------------------ No components found with this basename: POCBNP,    ---------------------------------------------------------------------------------------------------------------  Urinalysis    Component Value Date/Time   COLORURINE YELLOW 01/06/2013 1300   APPEARANCEUR CLOUDY* 01/06/2013 1300   LABSPEC 1.015 01/06/2013 1300   PHURINE 8.5* 01/06/2013 1300   GLUCOSEU NEGATIVE 01/06/2013 1300   HGBUR TRACE* 01/06/2013 1300   BILIRUBINUR SMALL* 01/06/2013 1300   KETONESUR TRACE* 01/06/2013 1300   PROTEINUR 100* 01/06/2013 1300   UROBILINOGEN 1.0 01/06/2013 1300   NITRITE POSITIVE* 01/06/2013 1300   LEUKOCYTESUR LARGE* 01/06/2013  1300    ----------------------------------------------------------------------------------------------------------------  Imaging results:   Dg Chest Port 1 View  01/06/2013   CLINICAL DATA:  Fever, anorexia, decreased responsiveness, history hypertension, Parkinson's, dementia  EXAM: PORTABLE CHEST - 1 VIEW  COMPARISON:  Portable exam 1310 hr without priors for comparison.  FINDINGS: Kyphotic positioning slightly limits exam.  Grossly normal heart size.  Tortuosity of thoracic aorta.  Bibasilar atelectasis.  Lungs otherwise clear.  No definite pleural effusion or pneumothorax.  Bones demineralized.  IMPRESSION: Bibasilar atelectasis.   Electronically Signed   By: Ulyses Southward M.D.   On: 01/06/2013 13:17    My personal review of EKG: Rhythm NSR, few PVCs with nonspecific ST changes    Assessment & Plan     1.  UTI-dehydration and Failure to thrive in an elderly woman with advanced Parkinson's-dementia-bedbound status - she will be admitted on MedSurg bed, gentle IV fluids and empiric IV Rocephin, feeding assistance along with aspiration precautions. She appears to be terminal with poor baseline quality of life, discussed in detail with son goal is to treat her here for the next few days thereafter discharged home with home hospice. I have explained to the son that her prognosis remains poor regardless.   2. Parkinson's disease. Continue home  medications for now.    3. Advanced dementia. Once better resuming Namenda, at risk for delirium.    4. Hyponatremia secondary to dehydration from poor oral intake and terminal decline. Obtained serum osmolality, urine sodium and osmolality, gentle hydration with normal saline, repeat BMP in the morning. Discontinue home dose diuretic.    5. Failure to thrive with decreased oral intake. Place her on dysphagia 1 diet as I doubt she can eat regular meals, feeding assistance and aspiration precautions. Will request speech to evaluate.    6.  Nonspecific EKG changes. She is not a candidate for invasive cardiac procedures be at diagnostic orthopedic. I will not cycle troponin as it would not change the management. Will place her on low-dose aspirin and beta blocker for now.     DVT Prophylaxis SCDs   AM Labs Ordered, also please review Full Orders  Family Communication: Admission, patients condition and plan of care including tests being ordered have been discussed with the patient and son (POA) who indicate understanding and agree with the plan and Code Status.  Code Status DNR  Likely DC to  Home with Hospice  Condition GUARDED++  Time spent in minutes : 35    Hellen Shanley K M.D on 01/06/2013 at 3:11 PM  Between 7am to 7pm - Pager - [phone]  After 7pm go to [email or web address] - password [email or web address]  And look for the night coverage person covering me after hours  [facility] Office  [phone]

## 2013-01-06 NOTE — ED Provider Notes (Signed)
CSN: [id number]     Arrival date & time 01/06/13  1109 History   This chart was scribed for Gilda Crease, MD, by Yevette Edwards, ED Scribe. This patient was seen in room [facility] and the patient's care was started at 11:51 AM.  First MD Initiated Contact with Patient 01/06/13 1150     Chief Complaint  Patient presents with  . Failure To Thrive   The history is provided by a relative. The history is limited by the condition of the patient. No language interpreter was used.   HPI Comments: [name] is a 79 y.o. female, with a h/o Parkinson's disease and HTN, who presents to the Emergency Department presenting with failure to thrive which has been gradually-increasing for the past two months. Her son reports she has experienced increased weakness, decreased ability to feed herself, voice changes, decreased appetite, and decreased communication. He states that she has not eaten well in the past week. In the ED, the pt has a temperature of 100.6 F. Her son denies the pt has had any cough, congestion, or rhinorrhea.  The pt is not ambulatory at baseline. She is a non-smoker.   Past Medical History  Diagnosis Date  . Hypertension   . Parkinson disease oseto  . Osteoarthritis   . Dementia    History reviewed. No pertinent past surgical history. No family history on file. History  Substance Use Topics  . Smoking status: Never Smoker   . Smokeless tobacco: Not on file  . Alcohol Use: No   No OB history provided.  Review of Systems  Constitutional: Positive for fever, activity change and appetite change.  HENT: Positive for voice change. Negative for congestion and rhinorrhea.   Respiratory: Negative for cough.   Neurological: Positive for weakness.  All other systems reviewed and are negative.    Allergies  Review of patient's allergies indicates no known allergies.  Home Medications  No current outpatient prescriptions on file.  Triage Vitals: BP 114/71  Pulse  106  Temp(Src) 100.6 F (38.1 C) (Oral)  Resp 22  SpO2 94%  Physical Exam  Constitutional: She appears well-nourished. No distress.  HENT:  Head: Normocephalic and atraumatic.  Right Ear: Hearing normal.  Left Ear: Hearing normal.  Nose: Nose normal.  Mouth/Throat: Oropharynx is clear and moist and mucous membranes are normal.  Eyes: Conjunctivae and EOM are normal. Pupils are equal, round, and reactive to light.  Neck: Normal range of motion. Neck supple.  Cardiovascular: Regular rhythm, S1 normal and S2 normal.  Exam reveals no gallop and no friction rub.   No murmur heard. Pulmonary/Chest: Effort normal and breath sounds normal. No respiratory distress. She exhibits no tenderness.  Abdominal: Soft. Normal appearance and bowel sounds are normal. There is no hepatosplenomegaly. There is no tenderness. There is no rebound, no guarding, no tenderness at McBurney's point and negative Murphy's sign. No hernia.  Musculoskeletal: Normal range of motion.  Neurological: She has normal strength. No sensory deficit. GCS eye subscore is 4. GCS verbal subscore is 5. GCS motor subscore is 6.  Skin: Skin is warm, dry and intact. No rash noted. No cyanosis.  Psychiatric: Her speech is normal.    ED Course  Procedures (including critical care time)  DIAGNOSTIC STUDIES: Oxygen Saturation is 94% on room air, adequate by my interpretation.    COORDINATION OF CARE:  11:55 AM- Discussed treatment plan with patient, and the patient agreed to the plan.   Labs Review Labs Reviewed  CBC  WITH DIFFERENTIAL - Abnormal; Notable for the following:    Hemoglobin 15.2 (*)    HCT 46.8 (*)    MCV 100.6 (*)    Neutrophils Relative % 84 (*)    Neutro Abs 7.9 (*)    Lymphocytes Relative 11 (*)    All other components within normal limits  BASIC METABOLIC PANEL - Abnormal; Notable for the following:    Sodium 150 (*)    Glucose, Bld 142 (*)    BUN 62 (*)    GFR calc non Af Amer 61 (*)    GFR calc Af  Amer 70 (*)    All other components within normal limits  URINALYSIS, ROUTINE W REFLEX MICROSCOPIC - Abnormal; Notable for the following:    APPearance CLOUDY (*)    pH 8.5 (*)    Hgb urine dipstick TRACE (*)    Bilirubin Urine SMALL (*)    Ketones, ur TRACE (*)    Protein, ur 100 (*)    Nitrite POSITIVE (*)    Leukocytes, UA LARGE (*)    All other components within normal limits  URINE MICROSCOPIC-ADD ON - Abnormal; Notable for the following:    Bacteria, UA MANY (*)    All other components within normal limits  CULTURE, BLOOD (ROUTINE X 2)  CULTURE, BLOOD (ROUTINE X 2)  URINE CULTURE  TROPONIN I   Imaging Review Dg Chest Port 1 View  01/06/2013   CLINICAL DATA:  Fever, anorexia, decreased responsiveness, history hypertension, Parkinson's, dementia  EXAM: PORTABLE CHEST - 1 VIEW  COMPARISON:  Portable exam 1310 hr without priors for comparison.  FINDINGS: Kyphotic positioning slightly limits exam.  Grossly normal heart size.  Tortuosity of thoracic aorta.  Bibasilar atelectasis.  Lungs otherwise clear.  No definite pleural effusion or pneumothorax.  Bones demineralized.  IMPRESSION: Bibasilar atelectasis.   Electronically Signed   By: Ulyses Southward M.D.   On: 01/06/2013 13:17    EKG Interpretation   None       MDM  Diagnosis: 1. Urinary tract infection 2. Dehydration  Patient brought to the emergency department by son for progressively worsening generalized weakness for approximately 6 weeks. He reports that in the last week, however, patient is significantly worsened. She is essentially lying in the bed, slumped over to the side and not interacting at all. This is a big change for her. She was found to be febrile at arrival. She has no significant leukocytosis and does not appear to be septic, but patient does have evidence of urinary tract infection. She also has evidence of dehydration with hypernatremia of 150 and significantly elevated BUN. Based on the patient's current  status, will require hospitalization for further management of dehydration and urinary tract infection.  EKG shows nonspecific changes in the ST and T waves. No comparison, last EKG was 2002. Troponin negative. Tachycardia is likely secondary to dehydration.  I personally performed the services described in this documentation, which was scribed in my presence. The recorded information has been reviewed and is accurate.     Gilda Crease, MD 01/06/13 [phone]

## 2013-01-06 NOTE — Plan of Care (Signed)
Attempted In/Out cath, but pt had and urinated during process before completion.  Passed in report that In/out cath would need to re-attempted in few hours.

## 2013-01-06 NOTE — ED Notes (Signed)
Son states his mom has not been eating or drinking well for a while. States he cannot get her to take anything by mouth now. Pt makes no verbal response.

## 2013-01-06 NOTE — ED Notes (Signed)
Pt evaluated for aadmission

## 2013-01-06 NOTE — Plan of Care (Signed)
Dietary gave pt's POA magic cup for pt.  Pt ate nothing except magic cup (100%).

## 2013-01-07 LAB — BASIC METABOLIC PANEL
BUN: 53 mg/dL — ABNORMAL HIGH (ref 6–23)
Calcium: 9.4 mg/dL (ref 8.4–10.5)
Creatinine, Ser: 0.58 mg/dL (ref 0.50–1.10)
GFR calc non Af Amer: 78 mL/min — ABNORMAL LOW (ref >90)
Glucose, Bld: 124 mg/dL — ABNORMAL HIGH (ref 70–99)
Sodium: 152 mEq/L — ABNORMAL HIGH (ref 135–145)

## 2013-01-07 LAB — CBC
HCT: 42.4 % (ref 36.0–46.0)
MCH: 32.9 pg (ref 26.0–34.0)
MCHC: 32.1 g/dL (ref 30.0–36.0)
Platelets: 185 10*3/uL (ref 150–400)
RBC: 4.14 MIL/uL (ref 3.87–5.11)
RDW: 14.5 % (ref 11.5–15.5)

## 2013-01-07 LAB — URINE CULTURE: Colony Count: 45000

## 2013-01-07 LAB — OSMOLALITY: Osmolality: 333 mOsm/kg — ABNORMAL HIGH (ref 275–300)

## 2013-01-07 MED ORDER — VANCOMYCIN HCL IN DEXTROSE 750-5 MG/150ML-% IV SOLN
750.0000 mg | Freq: Two times a day (BID) | INTRAVENOUS | Status: DC
  Administered 2013-01-07 – 2013-01-09 (×4): 750 mg via INTRAVENOUS
  Filled 2013-01-07 (×4): qty 150

## 2013-01-07 MED ORDER — POTASSIUM CHLORIDE CRYS ER 20 MEQ PO TBCR
40.0000 meq | EXTENDED_RELEASE_TABLET | Freq: Once | ORAL | Status: AC
  Administered 2013-01-07: 40 meq via ORAL
  Filled 2013-01-07: qty 2

## 2013-01-07 MED ORDER — VANCOMYCIN HCL IN DEXTROSE 750-5 MG/150ML-% IV SOLN
750.0000 mg | Freq: Two times a day (BID) | INTRAVENOUS | Status: DC
  Filled 2013-01-07 (×4): qty 150

## 2013-01-07 MED ORDER — DEXTROSE 5 % IV SOLN
INTRAVENOUS | Status: DC
  Administered 2013-01-07 – 2013-01-09 (×4): via INTRAVENOUS

## 2013-01-07 MED ORDER — VANCOMYCIN HCL IN DEXTROSE 1-5 GM/200ML-% IV SOLN
1000.0000 mg | Freq: Once | INTRAVENOUS | Status: AC
  Administered 2013-01-07: 1000 mg via INTRAVENOUS
  Filled 2013-01-07: qty 200

## 2013-01-07 NOTE — Progress Notes (Signed)
ANTIBIOTIC CONSULT NOTE - INITIAL  Pharmacy Consult for Vancomycin Indication: Gram Positive Cocci  No Known Allergies  Patient Measurements: Height: 5' (152.4 cm) Weight: 219 lb 9.3 oz (99.6 kg) IBW/kg (Calculated) : 45.5 Adjusted Body Weight:   Vital Signs: Temp: 99 F (37.2 C) (12/27 0459) Temp src: Oral (12/27 0459) BP: 130/73 mmHg (12/27 0459) Pulse Rate: 81 (12/27 0459) Intake/Output from previous day: 12/26 0701 - 12/27 0700 In: 1556.7 [I.V.:1556.7] Out: -  Intake/Output from this shift:    Labs:  Recent Labs  01/06/13 1320 01/07/13 0510  WBC 9.5 13.4*  HGB 15.2* 13.6  PLT 209 185  CREATININE 0.82 0.58   Estimated Creatinine Clearance: 48.5 ml/min (by C-G formula based on Cr of 0.58). No results found for this basename: VANCOTROUGH, Leodis Binet, VANCORANDOM, GENTTROUGH, GENTPEAK, GENTRANDOM, TOBRATROUGH, TOBRAPEAK, TOBRARND, AMIKACINPEAK, AMIKACINTROU, AMIKACIN,  in the last 72 hours   Microbiology: Recent Results (from the past 720 hour(s))  CULTURE, BLOOD (ROUTINE X 2)     Status: None   Collection Time    01/06/13  1:21 PM      Result Value Range Status   Specimen Description RIGHT ANTECUBITAL   Final   Special Requests BAA 8 CC   Final   Culture  Setup Time     Final   Value: GRAM POSITIVE COCCI Gram Stain Report Called to,Read Back By and Verified With: WRIGHT,M AT 0728 ON 01/07/13 BY GODFREY,O.   Culture PENDING   Incomplete   Report Status PENDING   Incomplete  CULTURE, BLOOD (ROUTINE X 2)     Status: None   Collection Time    01/06/13  1:29 PM      Result Value Range Status   Specimen Description BLOOD RIGHT HAND   Final   Special Requests AEROBIC 8CC   Final   Culture PENDING   Incomplete   Report Status PENDING   Incomplete    Medical History: Past Medical History  Diagnosis Date  . Hypertension   . Parkinson disease oseto  . Osteoarthritis   . Dementia     Medications:  Scheduled:  . chlordiazePOXIDE  5 mg Oral Daily   And  .  amitriptyline  12.5 mg Oral Daily  . aspirin EC  81 mg Oral Daily  . carbidopa-levodopa  3 tablet Oral Custom  . cefTRIAXone (ROCEPHIN)  IV  1 g Intravenous Q24H  . digoxin  0.125 mg Oral Daily  . LORazepam  0.5 mg Oral QHS  . metoprolol tartrate  12.5 mg Oral BID  . vancomycin  1,000 mg Intravenous Once  . vancomycin  750 mg Intravenous Q12H   Assessment: Admitted for UTI, dehydration Blood culture gram positive cocci  Goal of Therapy:  Vancomycin trough level 15-20 mcg/ml  Plan:  Vancomycin 1 GM IV load, then 750 mg IV every 12 hours Vancomycin trough at steady state F/U Cultures Monitor renal function Labs per protocol Raquel James, Kila Godina Bennett 01/07/2013,8:40 AM

## 2013-01-07 NOTE — Progress Notes (Signed)
Two attempts have been made to in and out cath pt this shift. She voids with movement and when we cath pt, we have no return. Will advise oncoming nurse to ask oncoming doctor to address.

## 2013-01-07 NOTE — Progress Notes (Signed)
INITIAL NUTRITION ASSESSMENT  DOCUMENTATION CODES Per approved criteria  -Severe malnutrition in the context of chronic illness   INTERVENTION: Magic cup TID with meals, each supplement provides 290 kcal and 9 grams of protein  NUTRITION DIAGNOSIS: Inadequate oral intake related to refusing food and fluids as evidenced by nutrition hx and nursing report ,dehydration on admission.   Goal: Pt to meet >/= 90% of their estimated nutrition needs   Monitor: plan of care, po intake, labs and wt trends  Reason for Assessment: Malnutrition Screen   79 y.o. female  Admitting Dx: UTI (lower urinary tract infection)  ASSESSMENT: Pt has hx includes Parkinsons, Dementia, scoliosis. She is bedbound and lives with her son. Limited  oral intake prior to admission She has UTI and was dehydrated on admission. Nutrition focused exam completed. Severe wasting of muscle and fat loss noted.    Height: Ht Readings from Last 1 Encounters:  01/06/13 5' (1.524 m)    Weight: Wt Readings from Last 1 Encounters:  01/07/13 86 lb 1.6 oz (39.055 kg)    Ideal Body Weight: 100#   % Ideal Body Weight: 86%  Wt Readings from Last 10 Encounters:  01/07/13 86 lb 1.6 oz (39.055 kg)  04/17/09 112 lb (50.803 kg)   Usual Body Weight: unable to confirm at this time  BMI:  Body mass index is 16.82 kg/(m^2). underweight  Estimated Nutritional Needs: Kcal: 1350-1500 Protein: 60-70gr Fluid: 1.4 liters daily  Skin: unstageable to heel  Diet Order: Dysphagia  EDUCATION NEEDS: -No education needs identified at this time   Intake/Output Summary (Last 24 hours) at 01/07/13 1631 Last data filed at 01/07/13 0900  Gross per 24 hour  Intake 1556.67 ml  Output      0 ml  Net 1556.67 ml    Last BM:  01/06/13  Labs:   Recent Labs Lab 01/06/13 1320 01/07/13 0510  NA 150* 152*  K 3.5 3.1*  CL 106 112  CO2 30 30  BUN 62* 53*  CREATININE 0.82 0.58  CALCIUM 10.5 9.4  GLUCOSE 142* 124*    CBG  (last 3)  No results found for this basename: GLUCAP,  in the last 72 hours  Scheduled Meds: . chlordiazePOXIDE  5 mg Oral Daily   And  . amitriptyline  12.5 mg Oral Daily  . aspirin EC  81 mg Oral Daily  . carbidopa-levodopa  3 tablet Oral Custom  . cefTRIAXone (ROCEPHIN)  IV  1 g Intravenous Q24H  . digoxin  0.125 mg Oral Daily  . LORazepam  0.5 mg Oral QHS  . metoprolol tartrate  12.5 mg Oral BID  . vancomycin  750 mg Intravenous Q12H    Continuous Infusions: . dextrose 75 mL/hr at 01/07/13 1349    Past Medical History  Diagnosis Date  . Hypertension   . Parkinson disease oseto  . Osteoarthritis   . Dementia     History reviewed. No pertinent past surgical history.  Royann Shivers MS,RD,CSG,LDN Office: [phone] Pager: [phone]

## 2013-01-07 NOTE — Progress Notes (Signed)
Subjective: Patient was admitted due to UTE and was started on Iv rocephin but lab reported that she is growing G+ cocci in blood.  Objective: Vital signs in last 24 hours: Temp:  [98.3 F (36.8 C)-100.6 F (38.1 C)] 99 F (37.2 C) (12/27 0459) Pulse Rate:  [81-111] 81 (12/27 0459) Resp:  [18-22] 20 (12/27 0459) BP: (111-130)/(65-73) 130/73 mmHg (12/27 0459) SpO2:  [94 %] 94 % (12/27 0459) Weight:  [39.055 kg (86 lb 1.6 oz)-99.6 kg (219 lb 9.3 oz)] 99.6 kg (219 lb 9.3 oz) (12/27 0459) Weight change:  Last BM Date: 01/06/13 (Per caregiver)  Intake/Output from previous day: 12/26 0701 - 12/27 0700 In: 1556.7 [I.V.:1556.7] Out: -   PHYSICAL EXAM General appearance: slowed mentation Resp: clear to auscultation bilaterally Cardio: S1, S2 normal GI: soft, non-tender; bowel sounds normal; no masses,  no organomegaly Extremities: extremities normal, atraumatic, no cyanosis or edema  Lab Results:    @labtest @ [facility] No results found for this basename: PHART, PCO2, PO2ART, TCO2, HCO3,  in the last 72 hours CULTURES Recent Results (from the past 240 hour(s))  CULTURE, BLOOD (ROUTINE X 2)     Status: None   Collection Time    01/06/13  1:21 PM      Result Value Range Status   Specimen Description RIGHT ANTECUBITAL   Final   Special Requests BAA 8 CC   Final   Culture  Setup Time     Final   Value: GRAM POSITIVE COCCI Gram Stain Report Called to,Read Back By and Verified With: WRIGHT,M AT 0728 ON 01/07/13 BY GODFREY,O.   Culture PENDING   Incomplete   Report Status PENDING   Incomplete  CULTURE, BLOOD (ROUTINE X 2)     Status: None   Collection Time    01/06/13  1:29 PM      Result Value Range Status   Specimen Description BLOOD RIGHT HAND   Final   Special Requests AEROBIC 8CC   Final   Culture PENDING   Incomplete   Report Status PENDING   Incomplete   Studies/Results: Dg Chest Port 1 View  01/06/2013   CLINICAL DATA:  Fever, anorexia, decreased responsiveness, history  hypertension, Parkinson's, dementia  EXAM: PORTABLE CHEST - 1 VIEW  COMPARISON:  Portable exam 1310 hr without priors for comparison.  FINDINGS: Kyphotic positioning slightly limits exam.  Grossly normal heart size.  Tortuosity of thoracic aorta.  Bibasilar atelectasis.  Lungs otherwise clear.  No definite pleural effusion or pneumothorax.  Bones demineralized.  IMPRESSION: Bibasilar atelectasis.   Electronically Signed   By: Ulyses Southward M.D.   On: 01/06/2013 13:17    Medications: I have reviewed the patient's current medications.  Assesment:  Principal Problem:   UTI (lower urinary tract infection) Active Problems:   LOW BACK PAIN   Parkinson disease   Hypertension   Dementia   Hyponatremia    Plan: Medications reviewed Will add vancomycin pending culture result Will adjust iv fluid Will follow culture result.    LOS: 1 day   Dailee Manalang 01/07/2013, 9:08 AM

## 2013-01-07 NOTE — Plan of Care (Signed)
RN and PCT attempted straight cath again. Pt had BM and bladder emptied before completion.  Pt unable to follow instructions and is incontinent.

## 2013-01-07 NOTE — Plan of Care (Addendum)
Critical Value: Blood cultures in ED 12/26 showed gram positive cocci.  Dr. Vickey Sages and gave verbal order to have pharm dose and start Vanco.  Orders placed.

## 2013-01-08 DIAGNOSIS — E43 Unspecified severe protein-calorie malnutrition: Secondary | ICD-10-CM | POA: Insufficient documentation

## 2013-01-08 LAB — BASIC METABOLIC PANEL
Calcium: 9.3 mg/dL (ref 8.4–10.5)
Chloride: 107 mEq/L (ref 96–112)
GFR calc Af Amer: >90 mL/min (ref >90)
GFR calc non Af Amer: 88 mL/min — ABNORMAL LOW (ref >90)
Sodium: 143 mEq/L (ref 135–145)

## 2013-01-08 MED ORDER — POTASSIUM CHLORIDE 10 MEQ/100ML IV SOLN
10.0000 meq | INTRAVENOUS | Status: AC
  Administered 2013-01-08 (×5): 10 meq via INTRAVENOUS
  Filled 2013-01-08: qty 100

## 2013-01-08 NOTE — Progress Notes (Signed)
Subjective: Patient is awake but remained confuse and very weak. Her po intake is poor.  Objective: Vital signs in last 24 hours: Temp:  [97.2 F (36.2 C)-97.9 F (36.6 C)] 97.9 F (36.6 C) (12/28 0456) Pulse Rate:  [74-95] 95 (12/28 0456) Resp:  [20] 20 (12/28 0456) BP: (111-132)/(39-74) 115/52 mmHg (12/28 0456) SpO2:  [94 %-95 %] 95 % (12/28 0456) Weight:  [45.405 kg (100 lb 1.6 oz)] 45.405 kg (100 lb 1.6 oz) (12/28 0456) Weight change: 6.35 kg (14 lb) Last BM Date: 01/06/13 (Per caregiver)  Intake/Output from previous day: 12/27 0701 - 12/28 0700 In: 1276.3 [I.V.:1276.3] Out: -   PHYSICAL EXAM General appearance: slowed mentation Resp: clear to auscultation bilaterally Cardio: S1, S2 normal GI: soft, non-tender; bowel sounds normal; no masses,  no organomegaly Extremities: extremities normal, atraumatic, no cyanosis or edema  Lab Results:    @labtest @ [facility] No results found for this basename: PHART, PCO2, PO2ART, TCO2, HCO3,  in the last 72 hours CULTURES Recent Results (from the past 240 hour(s))  URINE CULTURE     Status: None   Collection Time    01/06/13  1:00 PM      Result Value Range Status   Specimen Description URINE, CATHETERIZED   Final   Special Requests NONE   Final   Culture  Setup Time     Final   Value: 01/06/2013 17:37     Performed at [facility] Count     Final   Value: 45,000 COLONIES/ML     Performed at [facility]   Culture     Final   Value: Multiple bacterial morphotypes present, none predominant. Suggest appropriate recollection if clinically indicated.     Performed at [facility]   Report Status 01/07/2013 FINAL   Final  CULTURE, BLOOD (ROUTINE X 2)     Status: None   Collection Time    01/06/13  1:21 PM      Result Value Range Status   Specimen Description RIGHT ANTECUBITAL   Final   Special Requests BAA 8 CC   Final   Culture  Setup Time     Final   Value: GRAM POSITIVE COCCI Gram Stain  Report Called to,Read Back By and Verified With: WRIGHT,M AT 0728 ON 01/07/13 BY GODFREY,O.   Culture NO GROWTH 2 DAYS   Final   Report Status PENDING   Incomplete  CULTURE, BLOOD (ROUTINE X 2)     Status: None   Collection Time    01/06/13  1:29 PM      Result Value Range Status   Specimen Description BLOOD RIGHT HAND   Final   Special Requests AEROBIC 8CC   Final   Culture NO GROWTH 2 DAYS   Final   Report Status PENDING   Incomplete   Studies/Results: Dg Chest Port 1 View  01/06/2013   CLINICAL DATA:  Fever, anorexia, decreased responsiveness, history hypertension, Parkinson's, dementia  EXAM: PORTABLE CHEST - 1 VIEW  COMPARISON:  Portable exam 1310 hr without priors for comparison.  FINDINGS: Kyphotic positioning slightly limits exam.  Grossly normal heart size.  Tortuosity of thoracic aorta.  Bibasilar atelectasis.  Lungs otherwise clear.  No definite pleural effusion or pneumothorax.  Bones demineralized.  IMPRESSION: Bibasilar atelectasis.   Electronically Signed   By: Ulyses Southward M.D.   On: 01/06/2013 13:17    Medications: I have reviewed the patient's current medications.  Assesment:  Principal Problem:   UTI (  lower urinary tract infection) Active Problems:   LOW BACK PAIN   Parkinson disease   Hypertension   Dementia   Hyponatremia   Protein-calorie malnutrition, severe hypokalemia   Plan: Medications reviewed Will supplement K+ Will adjust iv fluid Will follow culture result.    LOS: 2 days   Burney Calzadilla 01/08/2013, 9:19 AM

## 2013-01-08 NOTE — Progress Notes (Signed)
Pt not eating much today. She was only able to get down 1/2 magic cup in AM and 1/2 magic cup around dinner time. Will continue to encourage PO intake and monitor.

## 2013-01-09 LAB — BASIC METABOLIC PANEL
BUN: 20 mg/dL (ref 6–23)
Calcium: 9.2 mg/dL (ref 8.4–10.5)
Creatinine, Ser: 0.53 mg/dL (ref 0.50–1.10)
GFR calc Af Amer: >90 mL/min (ref >90)
GFR calc non Af Amer: 81 mL/min — ABNORMAL LOW (ref >90)
Potassium: 4.3 mEq/L (ref 3.5–5.1)
Sodium: 140 mEq/L (ref 135–145)

## 2013-01-09 LAB — CULTURE, BLOOD (ROUTINE X 2)

## 2013-01-09 LAB — VANCOMYCIN, TROUGH: Vancomycin Tr: 16 ug/mL (ref 10.0–20.0)

## 2013-01-09 MED ORDER — DEXTROSE-NACL 5-0.45 % IV SOLN
INTRAVENOUS | Status: DC
  Administered 2013-01-09: 10:00:00 via INTRAVENOUS

## 2013-01-09 NOTE — Progress Notes (Signed)
UR chart review completed.  

## 2013-01-09 NOTE — Clinical Social Work Psychosocial (Signed)
Clinical Social Work Department BRIEF PSYCHOSOCIAL ASSESSMENT 01/09/2013  Patient:  [name], [name]     Account Number:  [id number]     Admit date:  01/06/2013  Clinical Social Worker:  Nancie Neas  Date/Time:  01/09/2013 01:10 PM  Referred by:  Care Management  Date Referred:  01/09/2013 Referred for  Residential hospice placement   Other Referral:   Interview type:  Family Other interview type:   Rosanne Ashing- son    PSYCHOSOCIAL DATA Living Status:  FAMILY Admitted from facility:   Level of care:   Primary support name:  Rosanne Ashing Primary support relationship to patient:  CHILD, ADULT Degree of support available:   supportive    CURRENT CONCERNS Current Concerns  Post-Acute Placement   Other Concerns:    SOCIAL WORK ASSESSMENT / PLAN CSW spoke with pt's son Rosanne Ashing as pt is not oriented. Rosanne Ashing reports that pt has lived with him for about 4 years. She has Parkinson's and Dementia. Rosanne Ashing indicates that her home health RN came to the home on Friday and recommended taking the pt to ED due to dehydration. Pt was found to have UTI. Rosanne Ashing reports she requires extensive assist at home and he is primary caregiver. The last few months he has noticed a rapid decline. For several weeks, she has not been taking much by mouth and has had swallowing issues. The home health RN recommended hospice referral and hospice was supposed to come to the home today to assess. Son is now requesting Hospice Home referral. CSW will discuss with MD and follow up with son.   Assessment/plan status:  Referral to [facility] Other assessment/ plan:   Information/referral to community resources:   Hospice Home    PATIENT'S/FAMILY'S RESPONSE TO PLAN OF CARE: Pt unable to discuss plan of care. CSW will follow up after discussing hospice home with MD.       Derenda Fennel, LCSW [phone]

## 2013-01-09 NOTE — Clinical Social Work Note (Signed)
CSW spoke with pt's son regarding transfer to Hospice Home today. MD in agreement. Pt to transfer today via [facility]. Will fax d/c summary tomorrow when completed. Out of facility DNR sent with pt.  Derenda Fennel, [place] [phone]

## 2013-01-09 NOTE — Progress Notes (Signed)
Pt BP is low. MD made aware and stated to hold metoprolol is SBP is < 100. Will continue to monitor.

## 2013-01-09 NOTE — Progress Notes (Signed)
NAMEASHAWNTI, TANGEN                  ACCOUNT NO.:  [id number]  MEDICAL RECORD NO.:  [id number]  LOCATION:  A314                          FACILITY:  [facility]  PHYSICIAN:  Kingsley Callander. Ouida Sills, MD       DATE OF BIRTH:  September 05, 1919  DATE OF PROCEDURE:  01/09/2013 DATE OF DISCHARGE:  01/09/2013                                PROGRESS NOTE   SUBJECTIVE:  Ms. Lamoreaux was admitted over the weekend with UTI and dehydration.  Her dehydration has improved.  Her BUN and creatinine have dropped from 53 and 0.58 to 20 and 0.53.  She has been treated with vancomycin and Rocephin.  Her urine culture reveals multiple species. She has had white counts of 9.5 and 13.4.  She was hypernatremic initially at 150 and 152 but is now down to 140.  She has been hypokalemic but has been treated to a level of 4.3 now.  She has Parkinson disease treated with Sinemet.  She has an advanced dementia. She is not eating.  She has 2 decubiti on her back.  It appears she is on 2 benzodiazepines and amitriptyline as well.  OBJECTIVE:  VITAL SIGNS:  She is not having fever.  Temperature is 97.8, pulse 75, blood pressures range from 87/45 to 146/69, oxygen saturation 94%. GENERAL:  She is lying in bed, and does not respond to verbal stimuli. She is able to open her eyes. HEENT:  Pharynx appears dry. LUNGS:  Clear. HEART:  Regular with a grade 1 systolic murmur. ABDOMEN:  Soft, nondistended, and nontender. EXTREMITIES:  No clubbing or edema. BACK:  Two shallow decubitus ulcers.  IMPRESSION/PLAN: 1. Dehydration, much improved with IV hydration. 2. Hypokalemia, resolved. 3. Urinary tract infection.  Culture though does not show definitive     organism. 4. Parkinson disease.  Her dementia appears advanced at this point,     she may be approaching a hospice level of care. 5. Code status.  DNR is in place. 6. Back decubiti.  Continue dressing changes and avoid pressure on     this area.     Kingsley Callander. Ouida Sills, MD    ROF/MEDQ  D:   01/09/2013  T:  01/09/2013  Job:  [id number]

## 2013-01-09 NOTE — Progress Notes (Signed)
While preparing pt for transport, she was found to have a bowel impaction. RN disimpacted pt and pt tolerated procedure well. Will continue to monitor.

## 2013-01-09 NOTE — Progress Notes (Signed)
MD at the bedside to assess patient. Informed him that patient had poor PO intake. Also informed him that her BP is 87/45 this morning. Will continue to monitor.

## 2013-01-09 NOTE — Care Management Note (Signed)
    Page 1 of 1   01/09/2013     12:48:20 PM   CARE MANAGEMENT NOTE 01/09/2013  Patient:  [name], [name]   Account Number:  [id number]  Date Initiated:  01/09/2013  Documentation initiated by:  Sharrie Rothman  Subjective/Objective Assessment:   Pt admitted from home with failure to thrive and UTI. Pt lives with her son and has a private caregiver M-Sat, 8-4 and then son takes care of pt. Pts daughter takes care of pt on Sunday. Pt is active with [facility] RN.     Action/Plan:   Family is interested in [facility]. Consult called and bed is available at Hospice home and family wants pt transferred to their facility. CSw to arrange discharge to facility once order written.   Anticipated DC Date:  01/09/2013   Anticipated DC Plan:  HOSPICE MEDICAL FACILITY  In-house referral  Clinical Social Worker      DC Planning Services  CM consult      Choice offered to / List presented to:             Status of service:  Completed, signed off Medicare Important Message given?   (If response is "NO", the following Medicare IM given date fields will be blank) Date Medicare IM given:   Date Additional Medicare IM given:    Discharge Disposition:  HOSPICE MEDICAL FACILITY  Per UR Regulation:    If discussed at Long Length of Stay Meetings, dates discussed:    Comments:  01/09/13 1245 Arlyss Queen, RN BSN CM

## 2013-01-09 NOTE — Progress Notes (Signed)
Pt is to be discharged to the hospice home today. Pt is in NAD, all paperwork has been reviewed/discussed with patient, and there are no questions/concerns at this time. Assessment is unchanged from this morning. Report has been called to [facility] at the [facility]. Staff at the hospice home asked that we leave IV access in, so it will be left in at this time. Pt is to be accompanied downstairs by staff and family via EMS.

## 2013-01-11 LAB — CULTURE, BLOOD (ROUTINE X 2): Culture: NO GROWTH

## 2013-01-11 NOTE — Discharge Summary (Signed)
[name], [name]                  ACCOUNT NO.:  [id number]  MEDICAL RECORD NO.:  [id number]  LOCATION:  A314                          FACILITY:  [facility]  PHYSICIAN:  Kingsley Callander. Ouida Sills, MD       DATE OF BIRTH:  October 30, 1919  DATE OF ADMISSION:  01/06/2013 DATE OF DISCHARGE:  12/29/2014LH                              DISCHARGE SUMMARY   DISCHARGE DIAGNOSES: 1. Dehydration. 2. Urinary tract infection. 3. Hypokalemia. 4. End-stage Parkinson disease with dementia. 5. Back decubiti.  HOSPITAL COURSE:  This patient is a 79 year old female with end-stage Parkinson disease and dementia, who presented with increasing weakness, lethargy, and inability to eat or drink.  She was found to be significantly dehydrated with BUN and creatinine of 53 and 0.58.  She had evidence of UTI on urinalysis.  Her culture has revealed multiple species.  She was initially treated with vancomycin and Rocephin, and then was consolidated to Rocephin only.  She had white counts of 9.5 and 13.4.  She was initially hypernatremic at 150 and 152, but improved to 140 with IV fluids.  She has remained minimally responsive.  She is not able to eat or drink.  Hospice was consulted, and the plan was made to transfer to the [facility]ospice [facility] for care there.  She has two decubiti on her back, pressure to these areas is being avoided.  She has really not been able to take medications orally.  She has not been able to continue to receive Sinemet.  She had previously been on digoxin at home.  She has been in sinus rhythm.  She had been on benzodiazepines as needed.  A DNR order is in place.  As noted, she will be transferred to Hospice.     Kingsley Callander. Ouida Sills, MD     ROF/MEDQ  D:  01/10/2013  T:  01/11/2013  Job:  [id number]

## 2013-02-06 ENCOUNTER — Telehealth: Payer: Self-pay

## 2013-02-06 NOTE — Telephone Encounter (Signed)
Patient past away @ [facility] per Obituary in [place] [employer] °

## 2013-02-12 DEATH — deceased

## 2014-05-12 IMAGING — CR DG CHEST 1V PORT
1 series · 1 of 1 positions shown · non-contrast
Comparison: Portable exam 6060 hr without priors for comparison.

CLINICAL DATA: Fever, anorexia, decreased responsiveness, history
hypertension, Parkinson's, dementia

EXAM:
PORTABLE CHEST - 1 VIEW

[portable]
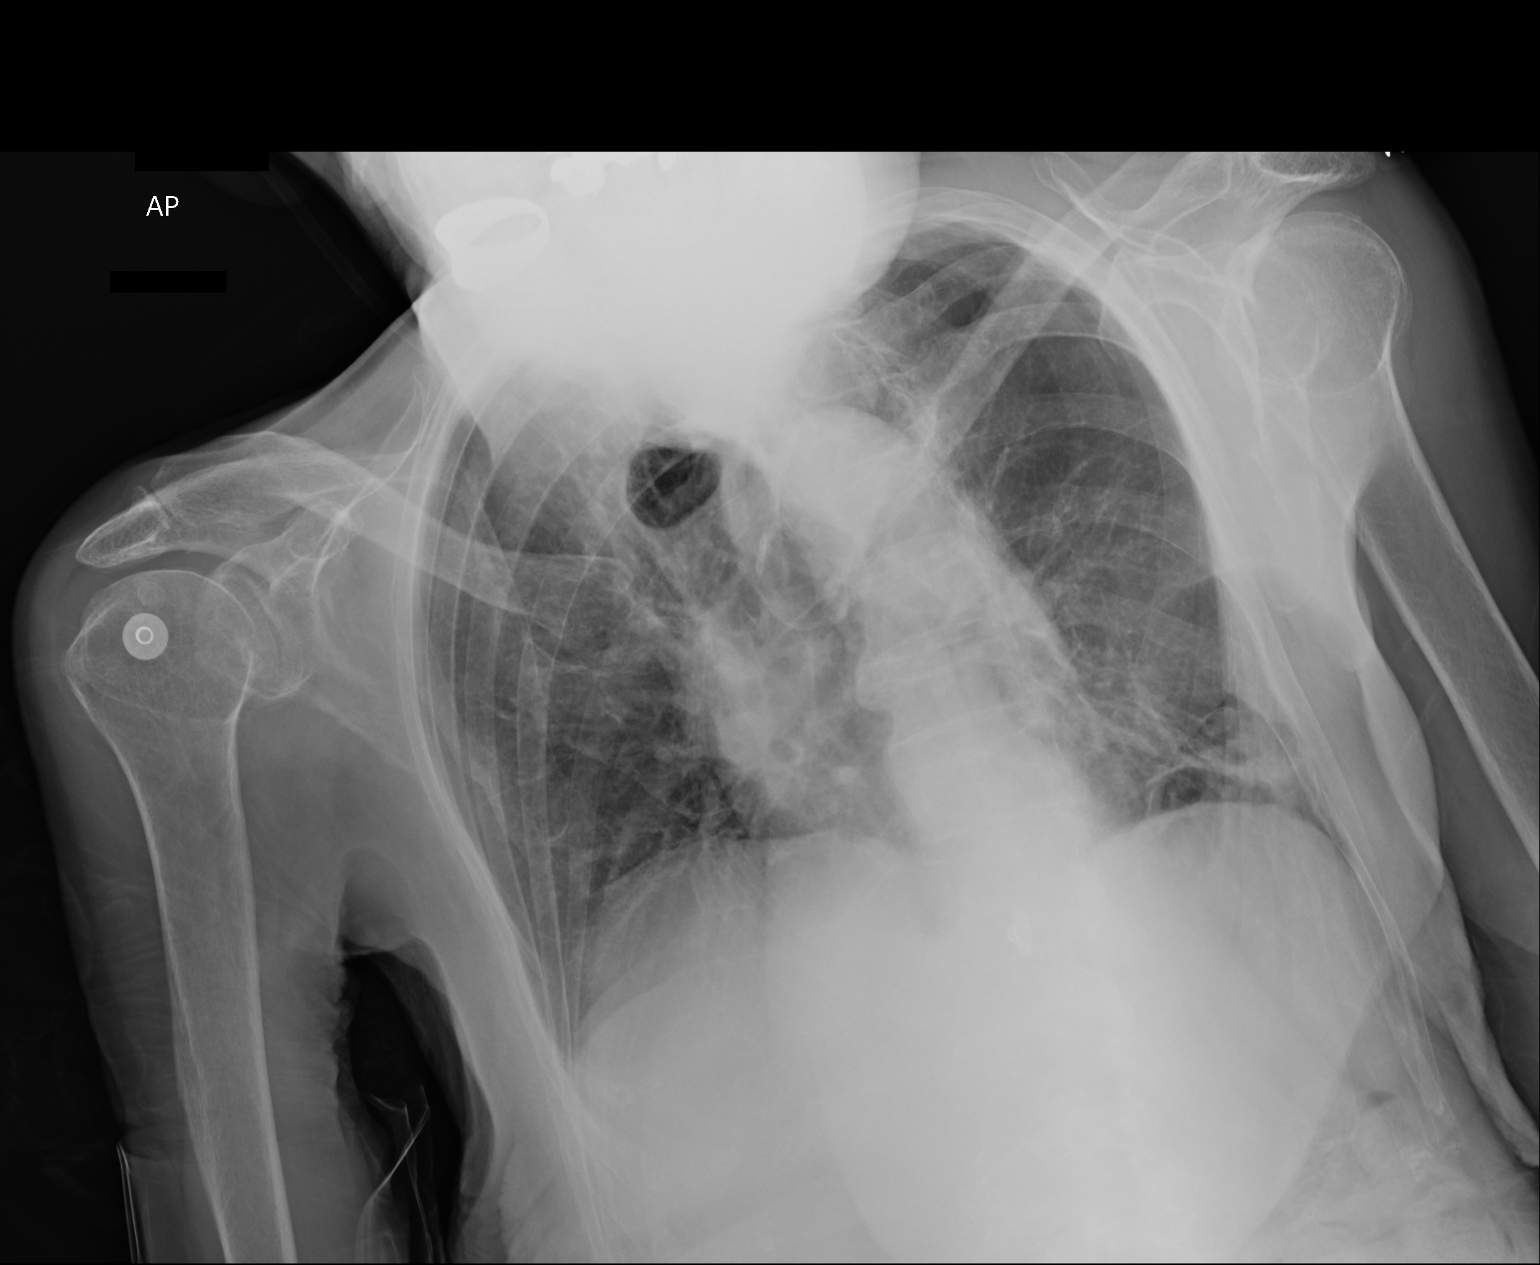

[1 of 1 positions shown; findings below may reference images not displayed]

FINDINGS: Kyphotic positioning slightly limits exam.

Grossly normal heart size.

Tortuosity of thoracic aorta.

Bibasilar atelectasis.

Lungs otherwise clear.

No definite pleural effusion or pneumothorax.

Bones demineralized.
IMPRESSION: Bibasilar atelectasis.
